# Patient Record
Sex: Male | Born: 1995 | Race: White | Hispanic: No | Marital: Single | Smoking: Never smoker
Health system: Southern US, Community
[De-identification: ages and names within clinical notes are randomized; demographics above are authoritative.]

## PROBLEM LIST (undated history)

## (undated) DIAGNOSIS — I10 Essential (primary) hypertension: Secondary | ICD-10-CM

## (undated) DIAGNOSIS — F329 Major depressive disorder, single episode, unspecified: Secondary | ICD-10-CM

## (undated) DIAGNOSIS — S82301A Unspecified fracture of lower end of right tibia, initial encounter for closed fracture: Secondary | ICD-10-CM

## (undated) DIAGNOSIS — K219 Gastro-esophageal reflux disease without esophagitis: Secondary | ICD-10-CM

## (undated) DIAGNOSIS — F909 Attention-deficit hyperactivity disorder, unspecified type: Secondary | ICD-10-CM

## (undated) DIAGNOSIS — A0472 Enterocolitis due to Clostridium difficile, not specified as recurrent: Secondary | ICD-10-CM

## (undated) DIAGNOSIS — F32A Depression, unspecified: Secondary | ICD-10-CM

## (undated) HISTORY — PX: WISDOM TOOTH EXTRACTION: SHX21

## (undated) HISTORY — DX: Major depressive disorder, single episode, unspecified: F32.9

## (undated) HISTORY — DX: Essential (primary) hypertension: I10

## (undated) HISTORY — DX: Depression, unspecified: F32.A

## (undated) HISTORY — DX: Gastro-esophageal reflux disease without esophagitis: K21.9

---
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
## 2014-05-26 ENCOUNTER — Encounter: Payer: Self-pay | Admitting: Internal Medicine

## 2014-05-26 ENCOUNTER — Ambulatory Visit (INDEPENDENT_AMBULATORY_CARE_PROVIDER_SITE_OTHER): Payer: 59 | Admitting: Internal Medicine

## 2014-05-26 VITALS — BP 114/64 | HR 110 | Temp 98.0°F | Ht 74.0 in | Wt 169.0 lb

## 2014-05-26 DIAGNOSIS — F909 Attention-deficit hyperactivity disorder, unspecified type: Secondary | ICD-10-CM | POA: Diagnosis not present

## 2014-05-26 DIAGNOSIS — H6691 Otitis media, unspecified, right ear: Secondary | ICD-10-CM

## 2014-05-26 DIAGNOSIS — H65112 Acute and subacute allergic otitis media (mucoid) (sanguinous) (serous), left ear: Secondary | ICD-10-CM

## 2014-05-26 DIAGNOSIS — H7291 Unspecified perforation of tympanic membrane, right ear: Principal | ICD-10-CM

## 2014-05-26 DIAGNOSIS — H66011 Acute suppurative otitis media with spontaneous rupture of ear drum, right ear: Secondary | ICD-10-CM

## 2014-05-26 DIAGNOSIS — F988 Other specified behavioral and emotional disorders with onset usually occurring in childhood and adolescence: Secondary | ICD-10-CM

## 2014-05-26 DIAGNOSIS — R040 Epistaxis: Secondary | ICD-10-CM

## 2014-05-26 MED ORDER — DOXYCYCLINE HYCLATE 100 MG PO TABS: 100.0000 mg | ORAL_TABLET | Freq: Two times a day (BID) | ORAL | Status: DC

## 2014-05-26 NOTE — Progress Notes (Signed)
   Subjective:    Patient ID: Zachary Gilbert, male    DOB: 01/16/1996, 18 y.o.   MRN: [id number]  HPI 19 year old male not seen here since 2014 with history of anxiety and attention deficit disorder. Currently on Adderall. Says his grades improved quite a bit after starting Adderall. Mother has stated in the past he suffers from lack of motivation. He had bilateral grommet tubes placed when he was about [age]-year-old. History of recurrent bouts of otitis media. He's intolerant of erythromycin and Biaxin-these cause nausea.  He's been on Paxil in the past and Concerta.  He went to the beach on February 25. Says his friend had a respiratory infection and he subsequently came down with it. He developed ear problems and apparently [facility] [place]. We have requested records but have not received them. Apparently had a perforated right ear drum and was placed on Ciprodex eardrops. He was not placed on oral antibiotics. Says both ears were hurting. He also has developed a left nosebleed which apparently was fairly severe couple of nights ago. Denies blowing his nose or nasal trauma.  He has some anxiety about losing his hearing. Says both ears are stopped up and he cannot hear.    Review of Systems     Objective:   Physical Exam  Dried blood in left nostril. Left TM is chronically scarred and retracted but not red. Pharynx is red. Tonsils are present without exudate. Right TM shows perforation right TM with no active drainage      Assessment & Plan:  Perforated right TM  Nosebleed  Left otitis media  Plan: Doxycycline 100 mg twice daily for 10 days. Continue using Ciprodex eardrops and return in 2 weeks. Note to be excused from class today. Patient asking if he can go to gym. They can be best to be staph the gym for the next week.  25 minutes spent with patient and also on the phone trying to obtain records and getting clear  history.

## 2014-05-26 NOTE — Patient Instructions (Addendum)
Doxycycline 100 mg twice a day  For 10 days. Use ear drops in right ear. Return in 2 weeks.

## 2014-06-10 ENCOUNTER — Ambulatory Visit (INDEPENDENT_AMBULATORY_CARE_PROVIDER_SITE_OTHER): Payer: 59 | Admitting: Internal Medicine

## 2014-06-10 ENCOUNTER — Encounter: Payer: Self-pay | Admitting: Internal Medicine

## 2014-06-10 VITALS — BP 102/78 | HR 82 | Temp 97.8°F | Ht 74.0 in | Wt 171.0 lb

## 2014-06-10 DIAGNOSIS — H7291 Unspecified perforation of tympanic membrane, right ear: Secondary | ICD-10-CM

## 2014-06-10 NOTE — Progress Notes (Signed)
   Subjective:    Patient ID: Zachary Gilbert, male    DOB: 02/28/1996, 19 y.o.   MRN: [id number]  HPI For follow-up on perforated right TM. Says he still feels air going through the tympanic membrane when he runs. Says it is not hurting. No drainage.    Review of Systems     Objective:   Physical Exam  Clean perforation right TM without drainage. Audiometry is normal at 20 and 40 dB respectively.      Assessment & Plan:  Perforation right TM  Normal hearing  Plan: Appointment with ENT physician for further advisement regarding healing etc

## 2014-06-10 NOTE — Patient Instructions (Signed)
You have normal hearing in the right ear. Appointment with ENT physician for further evaluation of perforation

## 2014-11-27 ENCOUNTER — Encounter (HOSPITAL_BASED_OUTPATIENT_CLINIC_OR_DEPARTMENT_OTHER): Payer: Self-pay | Admitting: Emergency Medicine

## 2014-11-27 ENCOUNTER — Emergency Department (HOSPITAL_BASED_OUTPATIENT_CLINIC_OR_DEPARTMENT_OTHER)
Admission: EM | Admit: 2014-11-27 | Discharge: 2014-11-27 | Disposition: A | Discharge status: Home / Self Care | Payer: 59 | Attending: Emergency Medicine | Admitting: Emergency Medicine

## 2014-11-27 ENCOUNTER — Emergency Department (HOSPITAL_BASED_OUTPATIENT_CLINIC_OR_DEPARTMENT_OTHER): Payer: 59

## 2014-11-27 DIAGNOSIS — R110 Nausea: Secondary | ICD-10-CM | POA: Insufficient documentation

## 2014-11-27 DIAGNOSIS — T402X5A Adverse effect of other opioids, initial encounter: Secondary | ICD-10-CM | POA: Insufficient documentation

## 2014-11-27 DIAGNOSIS — R109 Unspecified abdominal pain: Secondary | ICD-10-CM | POA: Insufficient documentation

## 2014-11-27 DIAGNOSIS — Z79899 Other long term (current) drug therapy: Secondary | ICD-10-CM | POA: Insufficient documentation

## 2014-11-27 DIAGNOSIS — K5903 Drug induced constipation: Secondary | ICD-10-CM

## 2014-11-27 DIAGNOSIS — K5900 Constipation, unspecified: Secondary | ICD-10-CM | POA: Insufficient documentation

## 2014-11-27 DIAGNOSIS — F909 Attention-deficit hyperactivity disorder, unspecified type: Secondary | ICD-10-CM | POA: Insufficient documentation

## 2014-11-27 HISTORY — DX: Attention-deficit hyperactivity disorder, unspecified type: F90.9

## 2014-11-27 MED ORDER — LACTULOSE 10 GM/15ML PO SOLN: 10.0000 g | Freq: Three times a day (TID) | ORAL | Status: DC

## 2014-11-27 MED ORDER — FLEET ENEMA 7-19 GM/118ML RE ENEM
1.0000 | ENEMA | Freq: Once | RECTAL | Status: AC
  Administered 2014-11-27: 1 via RECTAL
  Filled 2014-11-27: qty 1

## 2014-11-27 NOTE — ED Notes (Signed)
Patient c/o constipation, does not know for sure when his last BM was, but states it was at least 3 days ago. States he tried saline enema, suppository with no results.  Denies other symptoms other than bloating.

## 2014-11-27 NOTE — ED Notes (Signed)
MD at bedside. To inform of x-ray results and recommendation

## 2014-11-27 NOTE — Discharge Instructions (Signed)

## 2014-11-27 NOTE — ED Provider Notes (Signed)
CSN: [id number]     Arrival date & time 11/27/14  8295 History   None    Chief Complaint  Patient presents with  . Constipation    HPI  19 yo M h/o ADHD p/w constipation for 1 week after taking 3 Percocets recreationally. He tried magnesium and 2 enemas late last night and had some watery bowel movements but no solid stool. He says he felt a little nauseous this morning but denies vomiting, and he's passing gas. His abdomen doesn't hurt per say, but just feels bloated.  Past Medical History  Diagnosis Date  . ADHD (attention deficit hyperactivity disorder)    Past Surgical History  Procedure Laterality Date  . Wisdom tooth extraction     Family History  Problem Relation Age of Onset  . Anxiety disorder Mother    Social History  Substance Use Topics  . Smoking status: Never Smoker   . Smokeless tobacco: None  . Alcohol Use: Yes    Review of Systems  Constitutional: Positive for appetite change. Negative for fever.  Gastrointestinal: Positive for nausea, abdominal pain, constipation and abdominal distention. Negative for vomiting, diarrhea and blood in stool.  Genitourinary: Negative for dysuria.  Musculoskeletal: Negative for arthralgias.  Neurological: Negative for headaches.    Allergies  Biaxin; Erythorbic acid; and Travoprost (bak free)  Home Medications   Prior to Admission medications   Medication Sig Start Date End Date Taking? Authorizing Provider  lisdexamfetamine (VYVANSE) 20 MG capsule Take 5 mg by mouth daily.   Yes Historical Provider, MD  methylphenidate (RITALIN) 10 MG tablet Take 10 mg by mouth 2 (two) times daily.   Yes Historical Provider, MD  amphetamine-dextroamphetamine (ADDERALL) 10 MG tablet  05/01/14   Historical Provider, MD  CIPRODEX otic suspension  05/20/14   Historical Provider, MD  traMADol-acetaminophen (ULTRACET) 37.5-325 MG per tablet  05/20/14   Historical Provider, MD   BP 149/90 mmHg  Pulse 76  Temp(Src) 98.1 F (36.7 C) (Oral)  Resp  20  Ht  (1.905 m)  Wt 178 lb (80.74 kg)  BMI 22.25 kg/m2  SpO2 100% Physical Exam  Constitutional: He appears well-developed and well-nourished.  HENT:  Head: Normocephalic and atraumatic.  Eyes: Conjunctivae and EOM are normal.  Neck: Normal range of motion. Neck supple.  Cardiovascular: Normal rate and regular rhythm.   Pulmonary/Chest: Effort normal and breath sounds normal.  Abdominal: Soft. Bowel sounds are normal. He exhibits no distension. There is no tenderness. There is no rebound and no guarding.  Musculoskeletal: Normal range of motion.  Skin: Skin is warm and dry.  Psychiatric: He has a normal mood and affect. His behavior is normal.    ED Course  Procedures (including critical care time) Labs Review Labs Reviewed - No data to display  Imaging Review No results found. I have personally reviewed and evaluated these images and lab results as part of my medical decision-making.   EKG Interpretation None      MDM   Final diagnoses:  Constipation due to opioid therapy    19 yo M presents with constipation for 5 days after recreational percocet abuse. No nausea/vomiting, passing flatus, and abdominal exam was benign. KUB showed stool in small and large intestine. Fleets enema got out some stool and he was discharged home with lactulose and recommendation he steer clear from recreational opiate use in the future.  Selina Cooley, MD 11/27/14 1519  Rolan Bucco, MD 11/27/14 [phone]

## 2014-11-27 NOTE — ED Notes (Signed)
MD at bedside. 

## 2014-11-27 NOTE — ED Notes (Signed)
States has not had a solid formed stool "in awhile" states has tried enema and mag citrate with poor results

## 2014-11-27 NOTE — ED Notes (Signed)
States feels full and bloated at this time.

## 2017-03-30 ENCOUNTER — Emergency Department (HOSPITAL_COMMUNITY)
Admission: EM | Admit: 2017-03-30 | Discharge: 2017-03-31 | Disposition: A | Discharge status: Home / Self Care | Payer: 59 | Attending: Emergency Medicine | Admitting: Emergency Medicine

## 2017-03-30 ENCOUNTER — Emergency Department (HOSPITAL_COMMUNITY): Payer: 59

## 2017-03-30 DIAGNOSIS — Y939 Activity, unspecified: Secondary | ICD-10-CM | POA: Insufficient documentation

## 2017-03-30 DIAGNOSIS — S6291XA Unspecified fracture of right wrist and hand, initial encounter for closed fracture: Secondary | ICD-10-CM | POA: Diagnosis not present

## 2017-03-30 DIAGNOSIS — Y92410 Unspecified street and highway as the place of occurrence of the external cause: Secondary | ICD-10-CM | POA: Insufficient documentation

## 2017-03-30 DIAGNOSIS — Z79899 Other long term (current) drug therapy: Secondary | ICD-10-CM | POA: Diagnosis not present

## 2017-03-30 DIAGNOSIS — Z23 Encounter for immunization: Secondary | ICD-10-CM | POA: Diagnosis not present

## 2017-03-30 DIAGNOSIS — S99911A Unspecified injury of right ankle, initial encounter: Secondary | ICD-10-CM | POA: Diagnosis present

## 2017-03-30 DIAGNOSIS — F909 Attention-deficit hyperactivity disorder, unspecified type: Secondary | ICD-10-CM | POA: Diagnosis not present

## 2017-03-30 DIAGNOSIS — R079 Chest pain, unspecified: Secondary | ICD-10-CM | POA: Insufficient documentation

## 2017-03-30 DIAGNOSIS — R109 Unspecified abdominal pain: Secondary | ICD-10-CM | POA: Diagnosis not present

## 2017-03-30 DIAGNOSIS — R55 Syncope and collapse: Secondary | ICD-10-CM | POA: Diagnosis not present

## 2017-03-30 DIAGNOSIS — Y999 Unspecified external cause status: Secondary | ICD-10-CM | POA: Insufficient documentation

## 2017-03-30 DIAGNOSIS — E7119 Other disorders of branched-chain amino-acid metabolism: Secondary | ICD-10-CM

## 2017-03-30 DIAGNOSIS — S82891A Other fracture of right lower leg, initial encounter for closed fracture: Secondary | ICD-10-CM | POA: Diagnosis not present

## 2017-03-30 LAB — I-STAT CHEM 8, ED
BUN: 22 mg/dL — ABNORMAL HIGH (ref 6–20)
CALCIUM ION: 1.07 mmol/L — AB (ref 1.15–1.40)
CREATININE: 1.1 mg/dL (ref 0.61–1.24)
Chloride: 104 mmol/L (ref 101–111)
Glucose, Bld: 136 mg/dL — ABNORMAL HIGH (ref 65–99)
HEMATOCRIT: 43 % (ref 39.0–52.0)
HEMOGLOBIN: 14.6 g/dL (ref 13.0–17.0)
Potassium: 3.7 mmol/L (ref 3.5–5.1)
SODIUM: 140 mmol/L (ref 135–145)
TCO2: 24 mmol/L (ref 22–32)

## 2017-03-30 LAB — I-STAT CG4 LACTIC ACID, ED: Lactic Acid, Venous: 1.5 mmol/L (ref 0.5–1.9)

## 2017-03-30 MED ORDER — SODIUM CHLORIDE 0.9 % IV BOLUS (SEPSIS)
1000.0000 mL | Freq: Once | INTRAVENOUS | Status: AC
  Administered 2017-03-30: 1000 mL via INTRAVENOUS

## 2017-03-30 MED ORDER — TETANUS-DIPHTH-ACELL PERTUSSIS 5-2.5-18.5 LF-MCG/0.5 IM SUSP
0.5000 mL | Freq: Once | INTRAMUSCULAR | Status: AC
  Administered 2017-03-30: 0.5 mL via INTRAMUSCULAR
  Filled 2017-03-30: qty 0.5

## 2017-03-30 MED ORDER — SODIUM CHLORIDE 0.9 % IV BOLUS (SEPSIS)
1000.0000 mL | Freq: Once | INTRAVENOUS | Status: AC
  Administered 2017-03-31: 1000 mL via INTRAVENOUS

## 2017-03-30 MED ORDER — SODIUM CHLORIDE 0.9 % IV SOLN: INTRAVENOUS | Status: DC

## 2017-03-30 MED ORDER — FENTANYL CITRATE (PF) 100 MCG/2ML IJ SOLN
50.0000 ug | Freq: Once | INTRAMUSCULAR | Status: DC
Start: 2017-03-30 — End: 2017-03-31
  Filled 2017-03-30: qty 2

## 2017-03-30 MED ORDER — CLINDAMYCIN PHOSPHATE 600 MG/50ML IV SOLN
600.0000 mg | Freq: Once | INTRAVENOUS | Status: AC
  Administered 2017-03-30: 600 mg via INTRAVENOUS
  Filled 2017-03-30: qty 50
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
## 2017-03-31 ENCOUNTER — Emergency Department (HOSPITAL_COMMUNITY): Payer: 59

## 2017-03-31 LAB — CBC
HEMATOCRIT: 43 % (ref 39.0–52.0)
HEMOGLOBIN: 14.4 g/dL (ref 13.0–17.0)
MCH: 27.1 pg (ref 26.0–34.0)
MCHC: 33.5 g/dL (ref 30.0–36.0)
MCV: 81 fL (ref 78.0–100.0)
Platelets: 204 10*3/uL (ref 150–400)
RBC: 5.31 MIL/uL (ref 4.22–5.81)
RDW: 12.5 % (ref 11.5–15.5)
WBC: 12.9 10*3/uL — AB (ref 4.0–10.5)

## 2017-03-31 LAB — COMPREHENSIVE METABOLIC PANEL
ALBUMIN: 4 g/dL (ref 3.5–5.0)
ALT: 22 U/L (ref 17–63)
ANION GAP: 8 (ref 5–15)
AST: 25 U/L (ref 15–41)
Alkaline Phosphatase: 47 U/L (ref 38–126)
BILIRUBIN TOTAL: 1.5 mg/dL — AB (ref 0.3–1.2)
BUN: 21 mg/dL — AB (ref 6–20)
CHLORIDE: 105 mmol/L (ref 101–111)
CO2: 25 mmol/L (ref 22–32)
Calcium: 8.7 mg/dL — ABNORMAL LOW (ref 8.9–10.3)
Creatinine, Ser: 1.08 mg/dL (ref 0.61–1.24)
GFR calc Af Amer: >60 mL/min (ref >60)
Glucose, Bld: 136 mg/dL — ABNORMAL HIGH (ref 65–99)
Potassium: 3.7 mmol/L (ref 3.5–5.1)
Sodium: 138 mmol/L (ref 135–145)
TOTAL PROTEIN: 6.2 g/dL — AB (ref 6.5–8.1)

## 2017-03-31 LAB — URINALYSIS, ROUTINE W REFLEX MICROSCOPIC
BACTERIA UA: NONE SEEN
BILIRUBIN URINE: NEGATIVE
GLUCOSE, UA: NEGATIVE mg/dL
KETONES UR: NEGATIVE mg/dL
LEUKOCYTES UA: NEGATIVE
NITRITE: NEGATIVE
PH: 6 (ref 5.0–8.0)
PROTEIN: NEGATIVE mg/dL
Specific Gravity, Urine: 1.015 (ref 1.005–1.030)

## 2017-03-31 LAB — ETHANOL: Alcohol, Ethyl (B): <10 mg/dL (ref <10)

## 2017-03-31 LAB — PROTIME-INR
INR: 1.16
Prothrombin Time: 14.7 seconds (ref 11.4–15.2)

## 2017-03-31 LAB — SAMPLE TO BLOOD BANK

## 2017-03-31 MED ORDER — OXYCODONE-ACETAMINOPHEN 5-325 MG PO TABS: 1.0000 | ORAL_TABLET | Freq: Four times a day (QID) | ORAL | 0 refills | Status: DC | PRN

## 2017-03-31 MED ORDER — KETOROLAC TROMETHAMINE 30 MG/ML IJ SOLN
30.0000 mg | Freq: Once | INTRAMUSCULAR | Status: AC
  Administered 2017-03-31: 30 mg via INTRAVENOUS
  Filled 2017-03-31: qty 1

## 2017-03-31 MED ORDER — IOPAMIDOL (ISOVUE-300) INJECTION 61%
INTRAVENOUS | Status: AC
  Administered 2017-03-31: 100 mL
  Filled 2017-03-31: qty 100
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
## 2017-03-31 NOTE — ED Notes (Signed)
Ortho Tech at bedside to apply splints

## 2017-03-31 NOTE — ED Notes (Signed)
Pt and family understood dc material. NAD noted. Script given at dc 

## 2017-03-31 NOTE — ED Notes (Signed)
C-collar removed after speaking with Primary MD.

## 2017-04-16 ENCOUNTER — Other Ambulatory Visit: Payer: Self-pay | Admitting: Orthopedic Surgery

## 2017-04-17 ENCOUNTER — Encounter (HOSPITAL_COMMUNITY): Payer: Self-pay

## 2017-04-17 ENCOUNTER — Encounter (HOSPITAL_COMMUNITY)
Admission: RE | Admit: 2017-04-17 | Discharge: 2017-04-17 | Disposition: A | Discharge status: Home / Self Care | Payer: 59 | Source: Ambulatory Visit | Attending: Orthopedic Surgery | Admitting: Orthopedic Surgery

## 2017-04-17 ENCOUNTER — Other Ambulatory Visit: Payer: Self-pay

## 2017-04-17 DIAGNOSIS — S8251XA Displaced fracture of medial malleolus of right tibia, initial encounter for closed fracture: Secondary | ICD-10-CM | POA: Diagnosis not present

## 2017-04-17 DIAGNOSIS — X58XXXA Exposure to other specified factors, initial encounter: Secondary | ICD-10-CM | POA: Diagnosis not present

## 2017-04-17 DIAGNOSIS — F909 Attention-deficit hyperactivity disorder, unspecified type: Secondary | ICD-10-CM | POA: Diagnosis not present

## 2017-04-17 LAB — CBC
HCT: 44 % (ref 39.0–52.0)
Hemoglobin: 14.6 g/dL (ref 13.0–17.0)
MCH: 27.8 pg (ref 26.0–34.0)
MCHC: 33.2 g/dL (ref 30.0–36.0)
MCV: 83.7 fL (ref 78.0–100.0)
PLATELETS: 313 10*3/uL (ref 150–400)
RBC: 5.26 MIL/uL (ref 4.22–5.81)
RDW: 13.6 % (ref 11.5–15.5)
WBC: 5.2 10*3/uL (ref 4.0–10.5)

## 2017-04-17 LAB — SURGICAL PCR SCREEN
MRSA, PCR: NEGATIVE
Staphylococcus aureus: NEGATIVE

## 2017-04-17 NOTE — Pre-Procedure Instructions (Signed)
Therisa Doyneaylor A Mcnamee  04/17/2017      [id number] [place] [address] [place] [id number] Phone: [phone] Fax: [phone]  [facility] [place], [place] [facility]- [address] [place] [id number] Phone: [phone] Fax: [phone]    Your procedure is scheduled on April 18, 2017.  [facility] at 1030 AM.  Call this number if you have problems the morning of surgery:  [phone]   Remember:  Do not eat food or drink liquids after midnight.  Take these medicines the morning of surgery with A SIP OF WATER acetaminophen (tylenol)-if needed, vancomycin (vancocin).  Beginning now, STOP taking any Aspirin (unless otherwise instructed by your surgeon), Aleve, Naproxen, Ibuprofen, Motrin, Advil, Goody's, BC's, all herbal medications, fish oil, and all vitamins  Continue all other medications as instructed by your physician except follow the above medication instructions before surgery   Do not wear jewelry.  Do not wear lotions, powders, or colognes, or deodorant.  Men may shave face and neck.  Do not bring valuables to the hospital.  [facility] is not responsible for any belongings or valuables.  Contacts, dentures or bridgework may not be worn into surgery.  Leave your suitcase in the car.  After surgery it may be brought to your room.  For patients admitted to the hospital, discharge time will be determined by your treatment team.  Patients discharged the day of surgery will not be allowed to drive home.   Special instructions:   [facility]- Preparing For Surgery  Before surgery, you can play an important role. Because skin is not sterile, your skin needs to be as free of germs as possible. You can reduce the number of germs on your skin by washing with CHG (chlorahexidine gluconate) Soap before surgery.  CHG is an antiseptic cleaner which kills germs and bonds  with the skin to continue killing germs even after washing.  Please do not use if you have an allergy to CHG or antibacterial soaps. If your skin becomes reddened/irritated stop using the CHG.  Do not shave (including legs and underarms) for at least 48 hours prior to first CHG shower. It is OK to shave your face.  Please follow these instructions carefully.   1. Shower the NIGHT BEFORE SURGERY and the MORNING OF SURGERY with CHG.   2. If you chose to wash your hair, wash your hair first as usual with your normal shampoo.  3. After you shampoo, rinse your hair and body thoroughly to remove the shampoo.  4. Use CHG as you would any other liquid soap. You can apply CHG directly to the skin and wash gently with a scrungie or a clean washcloth.   5. Apply the CHG Soap to your body ONLY FROM THE NECK DOWN.  Do not use on open wounds or open sores. Avoid contact with your eyes, ears, mouth and genitals (private parts). Wash Face and genitals (private parts)  with your normal soap.  6. Wash thoroughly, paying special attention to the area where your surgery will be performed.  7. Thoroughly rinse your body with warm water from the neck down.  8. DO NOT shower/wash with your normal soap after using and rinsing off the CHG Soap.  9. Pat yourself dry with a CLEAN TOWEL.  10. Wear CLEAN PAJAMAS to bed the night before surgery, wear comfortable clothes the morning of surgery  11. Place  CLEAN SHEETS on your bed the night of your first shower and DO NOT SLEEP WITH PETS.  Day of Surgery: Do not apply any deodorants/lotions. Please wear clean clothes to the hospital/surgery center.    Please read over the following fact sheets that you were given. Pain Booklet, Coughing and Deep Breathing, MRSA Information and Surgical Site Infection Prevention

## 2017-04-17 NOTE — Progress Notes (Addendum)
PCP: Marlan PalauMary Baxley, MD  Cardiologist: pt denies  EKG: pt denies past year, no cardiac history  Stress test: pt denies ever  ECHO: pt denies ever  Cardiac Cath: pt denies ever  Chest x-ray: 1 view-03/31/17 in Epic-no recent respiratory infections/complications

## 2017-04-18 ENCOUNTER — Ambulatory Visit (HOSPITAL_COMMUNITY): Payer: 59 | Admitting: Certified Registered Nurse Anesthetist

## 2017-04-18 ENCOUNTER — Encounter (HOSPITAL_COMMUNITY)
Admission: RE | Disposition: A | Discharge status: Home / Self Care | Payer: Self-pay | Source: Ambulatory Visit | Attending: Orthopedic Surgery

## 2017-04-18 ENCOUNTER — Encounter (HOSPITAL_COMMUNITY): Payer: Self-pay | Admitting: *Deleted

## 2017-04-18 ENCOUNTER — Ambulatory Visit (HOSPITAL_COMMUNITY)
Admission: RE | Admit: 2017-04-18 | Discharge: 2017-04-18 | Disposition: A | Discharge status: Home / Self Care | Payer: 59 | Source: Ambulatory Visit | Attending: Orthopedic Surgery | Admitting: Orthopedic Surgery

## 2017-04-18 DIAGNOSIS — S8251XA Displaced fracture of medial malleolus of right tibia, initial encounter for closed fracture: Secondary | ICD-10-CM | POA: Diagnosis not present

## 2017-04-18 DIAGNOSIS — F909 Attention-deficit hyperactivity disorder, unspecified type: Secondary | ICD-10-CM | POA: Insufficient documentation

## 2017-04-18 DIAGNOSIS — S82301A Unspecified fracture of lower end of right tibia, initial encounter for closed fracture: Secondary | ICD-10-CM

## 2017-04-18 DIAGNOSIS — X58XXXA Exposure to other specified factors, initial encounter: Secondary | ICD-10-CM | POA: Insufficient documentation

## 2017-04-18 HISTORY — PX: ORIF ANKLE FRACTURE: SHX5408

## 2017-04-18 HISTORY — DX: Unspecified fracture of lower end of right tibia, initial encounter for closed fracture: S82.301A

## 2017-04-18 HISTORY — DX: Enterocolitis due to Clostridium difficile, not specified as recurrent: A04.72

## 2017-04-18 SURGERY — OPEN REDUCTION INTERNAL FIXATION (ORIF) ANKLE FRACTURE
Anesthesia: Regional | Site: Leg Lower | Laterality: Right

## 2017-04-18 MED ORDER — FENTANYL CITRATE (PF) 100 MCG/2ML IJ SOLN
100.0000 ug | Freq: Once | INTRAMUSCULAR | Status: AC
  Administered 2017-04-18: 100 ug via INTRAVENOUS

## 2017-04-18 MED ORDER — MIDAZOLAM HCL 2 MG/2ML IJ SOLN
2.0000 mg | Freq: Once | INTRAMUSCULAR | Status: AC
  Administered 2017-04-18: 2 mg via INTRAVENOUS

## 2017-04-18 MED ORDER — OXYCODONE HCL 5 MG PO TABS
ORAL_TABLET | ORAL | Status: AC
  Filled 2017-04-18: qty 1

## 2017-04-18 MED ORDER — FENTANYL CITRATE (PF) 100 MCG/2ML IJ SOLN
INTRAMUSCULAR | Status: AC
  Administered 2017-04-18: 100 ug via INTRAVENOUS
  Filled 2017-04-18: qty 2

## 2017-04-18 MED ORDER — FENTANYL CITRATE (PF) 100 MCG/2ML IJ SOLN
25.0000 ug | INTRAMUSCULAR | Status: DC | PRN
  Administered 2017-04-18 (×2): 25 ug via INTRAVENOUS

## 2017-04-18 MED ORDER — MIDAZOLAM HCL 2 MG/2ML IJ SOLN
INTRAMUSCULAR | Status: AC
  Administered 2017-04-18: 2 mg via INTRAVENOUS
  Filled 2017-04-18: qty 2

## 2017-04-18 MED ORDER — OXYCODONE HCL 5 MG/5ML PO SOLN: 5.0000 mg | Freq: Once | ORAL | Status: DC | PRN

## 2017-04-18 MED ORDER — DEXAMETHASONE SODIUM PHOSPHATE 10 MG/ML IJ SOLN
INTRAMUSCULAR | Status: AC
  Filled 2017-04-18: qty 1

## 2017-04-18 MED ORDER — LACTATED RINGERS IV SOLN
INTRAVENOUS | Status: DC
  Administered 2017-04-18 (×2): via INTRAVENOUS

## 2017-04-18 MED ORDER — PROPOFOL 10 MG/ML IV BOLUS
INTRAVENOUS | Status: DC | PRN
  Administered 2017-04-18: 200 mg via INTRAVENOUS

## 2017-04-18 MED ORDER — HYDROMORPHONE HCL 1 MG/ML IJ SOLN: 0.2500 mg | INTRAMUSCULAR | Status: DC | PRN

## 2017-04-18 MED ORDER — HYDROMORPHONE HCL 1 MG/ML IJ SOLN
0.2500 mg | INTRAMUSCULAR | Status: DC | PRN
Start: 2017-04-18 — End: 2017-04-18

## 2017-04-18 MED ORDER — ONDANSETRON HCL 4 MG/2ML IJ SOLN: 4.0000 mg | Freq: Once | INTRAMUSCULAR | Status: DC | PRN

## 2017-04-18 MED ORDER — ROCURONIUM BROMIDE 100 MG/10ML IV SOLN
INTRAVENOUS | Status: DC | PRN
  Administered 2017-04-18: 20 mg via INTRAVENOUS
  Administered 2017-04-18: 50 mg via INTRAVENOUS

## 2017-04-18 MED ORDER — DEXAMETHASONE SODIUM PHOSPHATE 10 MG/ML IJ SOLN
INTRAMUSCULAR | Status: DC | PRN
  Administered 2017-04-18: 10 mg via INTRAVENOUS

## 2017-04-18 MED ORDER — SUGAMMADEX SODIUM 200 MG/2ML IV SOLN
INTRAVENOUS | Status: AC
  Filled 2017-04-18: qty 2

## 2017-04-18 MED ORDER — ONDANSETRON HCL 4 MG/2ML IJ SOLN
INTRAMUSCULAR | Status: DC | PRN
  Administered 2017-04-18: 4 mg via INTRAVENOUS

## 2017-04-18 MED ORDER — OXYCODONE HCL 5 MG PO TABS: 5.0000 mg | ORAL_TABLET | Freq: Once | ORAL | Status: DC | PRN

## 2017-04-18 MED ORDER — ROCURONIUM BROMIDE 10 MG/ML (PF) SYRINGE
PREFILLED_SYRINGE | INTRAVENOUS | Status: AC
  Filled 2017-04-18: qty 5

## 2017-04-18 MED ORDER — 0.9 % SODIUM CHLORIDE (POUR BTL) OPTIME
TOPICAL | Status: DC | PRN
  Administered 2017-04-18: 1000 mL

## 2017-04-18 MED ORDER — CEFAZOLIN SODIUM-DEXTROSE 2-4 GM/100ML-% IV SOLN
INTRAVENOUS | Status: AC
  Filled 2017-04-18: qty 100

## 2017-04-18 MED ORDER — LIDOCAINE 2% (20 MG/ML) 5 ML SYRINGE
INTRAMUSCULAR | Status: DC | PRN
  Administered 2017-04-18: 100 mg via INTRAVENOUS

## 2017-04-18 MED ORDER — ONDANSETRON HCL 4 MG/2ML IJ SOLN
INTRAMUSCULAR | Status: AC
  Filled 2017-04-18: qty 2

## 2017-04-18 MED ORDER — OXYCODONE HCL 5 MG PO TABS
5.0000 mg | ORAL_TABLET | Freq: Once | ORAL | Status: AC | PRN
  Administered 2017-04-18: 5 mg via ORAL

## 2017-04-18 MED ORDER — VANCOMYCIN HCL IN DEXTROSE 1-5 GM/200ML-% IV SOLN
1000.0000 mg | Freq: Once | INTRAVENOUS | Status: AC
Start: 2017-04-18 — End: 2017-04-18
  Administered 2017-04-18: 1000 mg via INTRAVENOUS
  Filled 2017-04-18: qty 200

## 2017-04-18 MED ORDER — FENTANYL CITRATE (PF) 100 MCG/2ML IJ SOLN
INTRAMUSCULAR | Status: AC
  Filled 2017-04-18: qty 2

## 2017-04-18 MED ORDER — FENTANYL CITRATE (PF) 100 MCG/2ML IJ SOLN
INTRAMUSCULAR | Status: DC | PRN
  Administered 2017-04-18: 100 ug via INTRAVENOUS

## 2017-04-18 MED ORDER — CEFAZOLIN SODIUM-DEXTROSE 2-4 GM/100ML-% IV SOLN: 2.0000 g | INTRAVENOUS | Status: DC

## 2017-04-18 MED ORDER — MIDAZOLAM HCL 2 MG/2ML IJ SOLN
INTRAMUSCULAR | Status: DC | PRN
  Administered 2017-04-18: 2 mg via INTRAVENOUS

## 2017-04-18 MED ORDER — FENTANYL CITRATE (PF) 250 MCG/5ML IJ SOLN
INTRAMUSCULAR | Status: AC
  Filled 2017-04-18: qty 5

## 2017-04-18 MED ORDER — MIDAZOLAM HCL 2 MG/2ML IJ SOLN
INTRAMUSCULAR | Status: AC
  Filled 2017-04-18: qty 2

## 2017-04-18 MED ORDER — CHLORHEXIDINE GLUCONATE 4 % EX LIQD: 60.0000 mL | Freq: Once | CUTANEOUS | Status: DC

## 2017-04-18 MED ORDER — OXYCODONE HCL 5 MG PO TABS: 5.0000 mg | ORAL_TABLET | ORAL | 0 refills | Status: DC | PRN

## 2017-04-18 MED ORDER — OXYCODONE HCL 5 MG PO TABS
5.0000 mg | ORAL_TABLET | Freq: Once | ORAL | Status: DC | PRN
Start: 2017-04-18 — End: 2017-04-18

## 2017-04-18 MED ORDER — SUGAMMADEX SODIUM 200 MG/2ML IV SOLN
INTRAVENOUS | Status: DC | PRN
  Administered 2017-04-18: 160 mg via INTRAVENOUS

## 2017-04-18 MED ORDER — LIDOCAINE 2% (20 MG/ML) 5 ML SYRINGE
INTRAMUSCULAR | Status: AC
  Filled 2017-04-18: qty 10

## 2017-04-18 MED ORDER — PROPOFOL 10 MG/ML IV BOLUS
INTRAVENOUS | Status: AC
  Filled 2017-04-18: qty 40

## 2017-04-18 MED ORDER — OXYCODONE HCL 5 MG/5ML PO SOLN: 5.0000 mg | Freq: Once | ORAL | Status: AC | PRN

## 2017-04-18 SURGICAL SUPPLY — 67 items
BANDAGE ACE 4X5 VEL STRL LF (GAUZE/BANDAGES/DRESSINGS) ×2 IMPLANT
BANDAGE ACE 6X5 VEL STRL LF (GAUZE/BANDAGES/DRESSINGS) IMPLANT
BANDAGE ESMARK 6X9 LF (GAUZE/BANDAGES/DRESSINGS) ×1 IMPLANT
BENZOIN TINCTURE PRP APPL 2/3 (GAUZE/BANDAGES/DRESSINGS) IMPLANT
BIT DRILL 2.9 CANN QC NONSTRL (BIT) ×2 IMPLANT
BNDG COHESIVE 4X5 TAN STRL (GAUZE/BANDAGES/DRESSINGS) ×2 IMPLANT
BNDG ESMARK 6X9 LF (GAUZE/BANDAGES/DRESSINGS) ×2
BOOTCOVER CLEANROOM LRG (PROTECTIVE WEAR) ×2 IMPLANT
CANISTER SUCT 3000ML PPV (MISCELLANEOUS) ×2 IMPLANT
CLSR STERI-STRIP ANTIMIC 1/2X4 (GAUZE/BANDAGES/DRESSINGS) ×2 IMPLANT
COVER SURGICAL LIGHT HANDLE (MISCELLANEOUS) ×2 IMPLANT
CUFF TOURNIQUET SINGLE 34IN LL (TOURNIQUET CUFF) ×2 IMPLANT
CUFF TOURNIQUET SINGLE 44IN (TOURNIQUET CUFF) IMPLANT
DECANTER SPIKE VIAL GLASS SM (MISCELLANEOUS) IMPLANT
DRAPE C-ARM 42X72 X-RAY (DRAPES) IMPLANT
DRAPE C-ARMOR (DRAPES) IMPLANT
DRAPE INCISE IOBAN 66X45 STRL (DRAPES) IMPLANT
DRAPE OEC MINIVIEW 54X84 (DRAPES) ×2 IMPLANT
DRAPE U-SHAPE 47X51 STRL (DRAPES) ×2 IMPLANT
DRSG PAD ABDOMINAL 8X10 ST (GAUZE/BANDAGES/DRESSINGS) IMPLANT
DURAPREP 26ML APPLICATOR (WOUND CARE) ×2 IMPLANT
ELECT REM PT RETURN 9FT ADLT (ELECTROSURGICAL) ×2
ELECTRODE REM PT RTRN 9FT ADLT (ELECTROSURGICAL) ×1 IMPLANT
GAUZE SPONGE 4X4 12PLY STRL (GAUZE/BANDAGES/DRESSINGS) IMPLANT
GAUZE SPONGE 4X4 12PLY STRL LF (GAUZE/BANDAGES/DRESSINGS) ×2 IMPLANT
GAUZE XEROFORM 1X8 LF (GAUZE/BANDAGES/DRESSINGS) ×4 IMPLANT
GLOVE BIO SURGEON STRL SZ 6 (GLOVE) ×4 IMPLANT
GLOVE BIO SURGEON STRL SZ 6.5 (GLOVE) ×6 IMPLANT
GLOVE BIOGEL PI IND STRL 8.5 (GLOVE) ×1 IMPLANT
GLOVE BIOGEL PI INDICATOR 8.5 (GLOVE) ×1
GLOVE BIOGEL PI ORTHO PRO SZ8 (GLOVE) ×2
GLOVE ORTHO TXT STRL SZ7.5 (GLOVE) ×2 IMPLANT
GLOVE PI ORTHO PRO STRL SZ8 (GLOVE) ×2 IMPLANT
GLOVE SURG ORTHO 8.0 STRL STRW (GLOVE) ×2 IMPLANT
GLOVE SURG SS PI 6.0 STRL IVOR (GLOVE) ×4 IMPLANT
GOWN STRL REUS W/ TWL LRG LVL3 (GOWN DISPOSABLE) ×4 IMPLANT
GOWN STRL REUS W/ TWL XL LVL3 (GOWN DISPOSABLE) ×2 IMPLANT
GOWN STRL REUS W/TWL LRG LVL3 (GOWN DISPOSABLE) ×4
GOWN STRL REUS W/TWL XL LVL3 (GOWN DISPOSABLE) ×2
K-WIRE ACE 1.6X6 (WIRE) ×4
KIT BASIN OR (CUSTOM PROCEDURE TRAY) ×2 IMPLANT
KIT ROOM TURNOVER OR (KITS) ×2 IMPLANT
KWIRE ACE 1.6X6 (WIRE) ×2 IMPLANT
MANIFOLD NEPTUNE II (INSTRUMENTS) IMPLANT
MARKER SKIN DUAL TIP RULER LAB (MISCELLANEOUS) ×2 IMPLANT
NS IRRIG 1000ML POUR BTL (IV SOLUTION) ×2 IMPLANT
PACK ORTHO EXTREMITY (CUSTOM PROCEDURE TRAY) ×2 IMPLANT
PAD ABD 8X10 STRL (GAUZE/BANDAGES/DRESSINGS) ×2 IMPLANT
PAD ARMBOARD 7.5X6 YLW CONV (MISCELLANEOUS) ×2 IMPLANT
PAD CAST 4YDX4 CTTN HI CHSV (CAST SUPPLIES) ×1 IMPLANT
PADDING CAST COTTON 4X4 STRL (CAST SUPPLIES) ×1
PLATE SPIDER 16 (Washer) ×2 IMPLANT
SCREW ACE CAN 4.0 40M (Screw) ×4 IMPLANT
SPONGE LAP 4X18 X RAY DECT (DISPOSABLE) ×2 IMPLANT
SUCTION FRAZIER HANDLE 10FR (MISCELLANEOUS) ×1
SUCTION TUBE FRAZIER 10FR DISP (MISCELLANEOUS) ×1 IMPLANT
SUT MNCRL AB 3-0 PS2 18 (SUTURE) ×2 IMPLANT
SUT VIC AB 0 CT1 27 (SUTURE) ×1
SUT VIC AB 0 CT1 27XBRD ANBCTR (SUTURE) ×1 IMPLANT
SUT VIC AB 3-0 SH 8-18 (SUTURE) ×2 IMPLANT
SYR CONTROL 10ML LL (SYRINGE) ×2 IMPLANT
TOWEL OR 17X24 6PK STRL BLUE (TOWEL DISPOSABLE) IMPLANT
TOWEL OR 17X26 10 PK STRL BLUE (TOWEL DISPOSABLE) ×2 IMPLANT
TUBE CONNECTING 12X1/4 (SUCTIONS) ×2 IMPLANT
UNDERPAD 30X30 (UNDERPADS AND DIAPERS) ×2 IMPLANT
WATER STERILE IRR 1000ML POUR (IV SOLUTION) IMPLANT
YANKAUER SUCT BULB TIP NO VENT (SUCTIONS) IMPLANT

## 2017-04-18 NOTE — Progress Notes (Addendum)
Patient notified staff that he tested positive for Cdiff at Dr. Malena Edmanutlaws office.  Patient placed on enteric precautions  11:28 AM   Spoke with Dr. Dion SaucierLandau - he is aware that patient was positive for C.Diff   Patient was started on Vancomycin orally 1/10 by Dr. Dulce Sellarutlaw office note to be faxed to [facility].    IP consulted stated should continue enteric Precautions

## 2017-04-18 NOTE — Anesthesia Procedure Notes (Addendum)
Anesthesia Regional Block: Popliteal block   Pre-Anesthetic Checklist: ,, timeout performed, Correct Patient, Correct Site, Correct Laterality, Correct Procedure, Correct Position, site marked, Risks and benefits discussed,  Surgical consent,  Pre-op evaluation,  At surgeon's request and post-op pain management  Laterality: Right  Prep: chloraprep       Needles:  Injection technique: Single-shot  Needle Type: Stimulator Needle - 40      Needle Gauge: 22     Additional Needles:   Procedures:, nerve stimulator,,, ultrasound used (permanent image in chart),,,,  Narrative:  Start time: 04/18/2017 12:00 PM End time: 04/18/2017 12:10 PM Injection made incrementally with aspirations every 5 mL.  Performed by: Personally   Additional Notes: 25 cc 0.5% bupivacaine with 1:200 epi injected easily

## 2017-04-18 NOTE — Discharge Instructions (Signed)

## 2017-04-18 NOTE — Anesthesia Procedure Notes (Addendum)
Anesthesia Regional Block: Adductor canal block   Pre-Anesthetic Checklist: ,, timeout performed, Correct Patient, Correct Site, Correct Laterality, Correct Procedure, Correct Position, site marked, Risks and benefits discussed,  Surgical consent,  Pre-op evaluation,  At surgeon's request and post-op pain management  Laterality: Right  Prep: chloraprep       Needles:  Injection technique: Single-shot  Needle Type: Echogenic Stimulator Needle          Additional Needles:   Procedures:,,,, ultrasound used (permanent image in chart),,,,  Narrative:  Start time: 04/18/2017 12:00 PM End time: 04/18/2017 12:05 PM Injection made incrementally with aspirations every 5 mL.  Performed by: Personally  Anesthesiologist: Kipp BroodJoslin, Kristianna Saperstein, MD  Additional Notes: 15 cc 0.75% Ropivacaine injected easily

## 2017-04-18 NOTE — Anesthesia Procedure Notes (Signed)
Procedure Name: Intubation Date/Time: 04/18/2017 1:08 PM Performed by: Leonor Liv, CRNA Pre-anesthesia Checklist: Patient identified, Emergency Drugs available, Suction available and Patient being monitored Patient Re-evaluated:Patient Re-evaluated prior to induction Oxygen Delivery Method: Circle System Utilized Preoxygenation: Pre-oxygenation with 100% oxygen Induction Type: IV induction Ventilation: Mask ventilation without difficulty Laryngoscope Size: Mac and 4 Grade View: Grade I Tube type: Oral Tube size: 7.5 mm Number of attempts: 1 Airway Equipment and Method: Stylet and Oral airway Placement Confirmation: ETT inserted through vocal cords under direct vision,  positive ETCO2 and breath sounds checked- equal and bilateral Secured at: 22 cm Tube secured with: Tape Dental Injury: Teeth and Oropharynx as per pre-operative assessment

## 2017-04-18 NOTE — Transfer of Care (Signed)
Immediate Anesthesia Transfer of Care Note  Patient: Zachary Gilbert  Procedure(s) Performed: RIGHT OPEN REDUCTION INTERNAL FIXATION (ORIF) DISTAL TIBIA FRACTURE AND MEDIAL MALLEOLAR FRACTURE (Right Leg Lower)  Patient Location: PACU  Anesthesia Type:General  Level of Consciousness: awake, alert  and oriented  Airway & Oxygen Therapy: Patient Spontanous Breathing and Patient connected to nasal cannula oxygen  Post-op Assessment: Report given to RN, Post -op Vital signs reviewed and stable and Patient moving all extremities  Post vital signs: Reviewed and stable  Last Vitals:  Vitals:   04/18/17 1029 04/18/17 1513  BP: 129/74 125/89  Pulse: 94 (!) 115  Resp: 20 13  Temp: 36.7 C 37 C  SpO2: 99% 96%    Last Pain:  Vitals:   04/18/17 1029  TempSrc: Oral      Patients Stated Pain Goal: 4 (04/18/17 1031)  Complications: No apparent anesthesia complications
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
## 2017-04-18 NOTE — Anesthesia Postprocedure Evaluation (Signed)
Anesthesia Post Note  Patient: Zachary Gilbert  Procedure(s) Performed: RIGHT OPEN REDUCTION INTERNAL FIXATION (ORIF) DISTAL TIBIA FRACTURE AND MEDIAL MALLEOLAR FRACTURE (Right Leg Lower)     Patient location during evaluation: PACU Anesthesia Type: General and Regional Level of consciousness: awake and alert Pain management: pain level controlled Vital Signs Assessment: post-procedure vital signs reviewed and stable Respiratory status: spontaneous breathing, nonlabored ventilation, respiratory function stable and patient connected to nasal cannula oxygen Cardiovascular status: blood pressure returned to baseline and stable Postop Assessment: no apparent nausea or vomiting Anesthetic complications: no    Last Vitals:  Vitals:   04/18/17 1545 04/18/17 1555  BP:  124/81  Pulse: (!) 107 (!) 104  Resp: 17 18  Temp:    SpO2: 99% 100%    Last Pain:  Vitals:   04/18/17 1555  TempSrc:   PainSc: 4                  John Williamsen

## 2017-04-18 NOTE — Op Note (Addendum)
04/18/2017  2:37 PM  PATIENT:  Zachary Gilbert    PRE-OPERATIVE DIAGNOSIS:  RIGHT distal tibia and medial malleolus fracture, intra-articular  POST-OPERATIVE DIAGNOSIS:  Same  PROCEDURE:  RIGHT OPEN REDUCTION INTERNAL FIXATION (ORIF) DISTAL TIBIA FRACTURE ARTICULAR PLAFOND AND AND MEDIAL MALLEOLAR FRACTURE           SURGEON:  Eulas Post, MD  PHYSICIAN ASSISTANT: Janace Litten, OPA-C, present and scrubbed throughout the case, critical for completion in a timely fashion, and for retraction, instrumentation, and closure.  ANESTHESIA:   General  PREOPERATIVE INDICATIONS:  Zachary Gilbert is a  22 y.o. male who broke his right distal tibia and medial malleolus about 2 weeks ago, and also had an index metacarpal fracture, and elected for surgical management to restore the articular surface of the ankle.  The risks benefits and alternatives were discussed with the patient preoperatively including but not limited to the risks of infection, bleeding, nerve injury, cardiopulmonary complications, the need for revision surgery, among others, and the patient was willing to proceed.  We also discussed the risks for malunion, nonunion, posttraumatic arthritis, hardware prominence, hardware failure, need for hardware removal, neurologic entrapment and injury to the lower extremity.  ESTIMATED BLOOD LOSS: Minimal  OPERATIVE IMPLANTS: Biomet 4.0 mm cannulated screw x1 for the medial malleolus with a 4.0 mm cannulated screw with a spider washer taken directly down to bone under direct visualization for the anterior tibial plafond.  OPERATIVE FINDINGS: Displaced intra-articular fracture of the distal tibia anteriorly, the syndesmosis appeared stable, the medial malleolus had slight displacement that was more than it was on the CAT scan preoperatively, bone quality was reasonably good.  OPERATIVE PROCEDURE: The patient was brought to the operating room and placed in supine position.  General  anesthesia was administered.  He was already on IV vancomycin, and we continued his regimen, given that he had C. difficile colitis.  The right lower extremity was prepped and draped in usual sterile fashion.  Timeout performed.  The leg was elevated and exsanguinated and the tourniquet was inflated.  Medial malleolus was evaluated using the C-arm after a timeout performed, and I felt that the fracture was slightly displaced compared to where it was at the preoperative CAT scan, so I did make an incision over the medial malleolus in order to adjust the fracture position to get it as anatomic as possible.  I placed a guidepin through the central portion of the main fragment, and then placed a 40 mm screw that had excellent purchase, compression at the fracture site, and anatomic restoration of the articular cartilage at the articular plafond as confirmed by C-arm.  I then went to the anterior fracture, and localized it under C-arm guidance, made an incision along the anterior aspect of the ankle, dissected bluntly down through the extensor retinaculum and musculature protecting the neurovascular bundle, exposed the fracture site, manipulated it, it was not particularly mobile but I was ultimately able to satisfactorily reduce it distally in order to restore the congruency of the plafond and.  Once this was in the appropriate position and confirmed on C-arm I used a guidewire for the cannulated screw, placed this across the fracture site, measured the length directly to the posterior most aspect of the tibia, I did not feel I needed to be bicortical because I had good cancellus bone of the large segment of that posteriorly, I applied a washer, I took care to make sure that the washer did not ensnare any of the  soft tissue or musculature or neurovascular structures surrounding.  This was directly opposed to bone.  I used C-arm to confirm anatomic alignment of the ankle and was satisfactory with the reduction, and  then removed the instruments, irrigated the wound copiously and repaired the subtenons tissue with Vicryl followed by routine closure for the skin.  A posterior splint was applied.  He already had a saphenous nerve block as well as a popliteal block, and we also applied a radial gutter cast to his right upper extremity for his index metacarpal fracture.  There were no complications and he tolerated the procedure well.  3 views of the right ankle taken at the completion of the case demonstrate anatomic alignment with internal fixation and restoration of the articular surface.

## 2017-04-18 NOTE — Anesthesia Preprocedure Evaluation (Addendum)

## 2017-04-21 ENCOUNTER — Encounter (HOSPITAL_COMMUNITY): Payer: Self-pay | Admitting: Orthopedic Surgery

## 2017-06-25 ENCOUNTER — Encounter: Payer: Self-pay | Admitting: Family Medicine

## 2017-06-25 ENCOUNTER — Ambulatory Visit: Payer: 59 | Admitting: Family Medicine

## 2017-06-25 VITALS — BP 136/72 | HR 96 | Temp 97.8°F | Wt 173.0 lb

## 2017-06-25 DIAGNOSIS — Z8659 Personal history of other mental and behavioral disorders: Secondary | ICD-10-CM | POA: Diagnosis not present

## 2017-06-25 DIAGNOSIS — R030 Elevated blood-pressure reading, without diagnosis of hypertension: Secondary | ICD-10-CM | POA: Diagnosis not present

## 2017-06-25 DIAGNOSIS — Z0000 Encounter for general adult medical examination without abnormal findings: Secondary | ICD-10-CM
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
## 2017-06-25 NOTE — Patient Instructions (Signed)
Preventive Care 18-39 Years, Male Preventive care refers to lifestyle choices and visits with your health care provider that can promote health and wellness. What does preventive care include?  A yearly physical exam. This is also called an annual well check.  Dental exams once or twice a year.  Routine eye exams. Ask your health care provider how often you should have your eyes checked.  Personal lifestyle choices, including: ? Daily care of your teeth and gums. ? Regular physical activity. ? Eating a healthy diet. ? Avoiding tobacco and drug use. ? Limiting alcohol use. ? Practicing safe sex. What happens during an annual well check? The services and screenings done by your health care provider during your annual well check will depend on your age, overall health, lifestyle risk factors, and family history of disease. Counseling Your health care provider may ask you questions about your:  Alcohol use.  Tobacco use.  Drug use.  Emotional well-being.  Home and relationship well-being.  Sexual activity.  Eating habits.  Work and work Statistician.  Screening You may have the following tests or measurements:  Height, weight, and BMI.  Blood pressure.  Lipid and cholesterol levels. These may be checked every 5 years starting at age 9.  Diabetes screening. This is done by checking your blood sugar (glucose) after you have not eaten for a while (fasting).  Skin check.  Hepatitis C blood test.  Hepatitis B blood test.  Sexually transmitted disease (STD) testing.  Discuss your test results, treatment options, and if necessary, the need for more tests with your health care provider. Vaccines Your health care provider may recommend certain vaccines, such as:  Influenza vaccine. This is recommended every year.  Tetanus, diphtheria, and acellular pertussis (Tdap, Td) vaccine. You may need a Td booster every 10 years.  Varicella vaccine. You may need this if you  have not been vaccinated.  HPV vaccine. If you are 89 or younger, you may need three doses over 6 months.  Measles, mumps, and rubella (MMR) vaccine. You may need at least one dose of MMR.You may also need a second dose.  Pneumococcal 13-valent conjugate (PCV13) vaccine. You may need this if you have certain conditions and have not been vaccinated.  Pneumococcal polysaccharide (PPSV23) vaccine. You may need one or two doses if you smoke cigarettes or if you have certain conditions.  Meningococcal vaccine. One dose is recommended if you are age 32-21 years and a first-year college student living in a residence hall, or if you have one of several medical conditions. You may also need additional booster doses.  Hepatitis A vaccine. You may need this if you have certain conditions or if you travel or work in places where you may be exposed to hepatitis A.  Hepatitis B vaccine. You may need this if you have certain conditions or if you travel or work in places where you may be exposed to hepatitis B.  Haemophilus influenzae type b (Hib) vaccine. You may need this if you have certain risk factors.  Talk to your health care provider about which screenings and vaccines you need and how often you need them. This information is not intended to replace advice given to you by your health care provider. Make sure you discuss any questions you have with your health care provider. Document Released: 05/07/2001 Document Revised: 11/29/2015 Document Reviewed: 01/10/2015 Elsevier Interactive Patient Education  2018 [employer].  Managing Your Hypertension Hypertension is commonly called high blood pressure. This is when the  force of your blood pressing against the walls of your arteries is too strong. Arteries are blood vessels that carry blood from your heart throughout your body. Hypertension forces the heart to work harder to pump blood, and may cause the arteries to become narrow or stiff. Having  untreated or uncontrolled hypertension can cause heart attack, stroke, kidney disease, and other problems. What are blood pressure readings? A blood pressure reading consists of a higher number over a lower number. Ideally, your blood pressure should be below 120/80. The first ("top") number is called the systolic pressure. It is a measure of the pressure in your arteries as your heart beats. The second ("bottom") number is called the diastolic pressure. It is a measure of the pressure in your arteries as the heart relaxes. What does my blood pressure reading mean? Blood pressure is classified into four stages. Based on your blood pressure reading, your health care provider may use the following stages to determine what type of treatment you need, if any. Systolic pressure and diastolic pressure are measured in a unit called mm Hg. Normal  Systolic pressure: below 601.  Diastolic pressure: below 80. Elevated  Systolic pressure: 093-235.  Diastolic pressure: below 80. Hypertension stage 1  Systolic pressure: 573-220.  Diastolic pressure: 25-42. Hypertension stage 2  Systolic pressure: 706 or above.  Diastolic pressure: 90 or above. What health risks are associated with hypertension? Managing your hypertension is an important responsibility. Uncontrolled hypertension can lead to:  A heart attack.  A stroke.  A weakened blood vessel (aneurysm).  Heart failure.  Kidney damage.  Eye damage.  Metabolic syndrome.  Memory and concentration problems.  What changes can I make to manage my hypertension? Hypertension can be managed by making lifestyle changes and possibly by taking medicines. Your health care provider will help you make a plan to bring your blood pressure within a normal range. Eating and drinking  Eat a diet that is high in fiber and potassium, and low in salt (sodium), added sugar, and fat. An example eating plan is called the DASH (Dietary Approaches to Stop  Hypertension) diet. To eat this way: ? Eat plenty of fresh fruits and vegetables. Try to fill half of your plate at each meal with fruits and vegetables. ? Eat whole grains, such as whole wheat pasta, brown rice, or whole grain bread. Fill about one quarter of your plate with whole grains. ? Eat low-fat diary products. ? Avoid fatty cuts of meat, processed or cured meats, and poultry with skin. Fill about one quarter of your plate with lean proteins such as fish, chicken without skin, beans, eggs, and tofu. ? Avoid premade and processed foods. These tend to be higher in sodium, added sugar, and fat.  Reduce your daily sodium intake. Most people with hypertension should eat less than 1,500 mg of sodium a day.  Limit alcohol intake to no more than 1 drink a day for nonpregnant women and 2 drinks a day for men. One drink equals 12 oz of beer, 5 oz of wine, or 1 oz of hard liquor. Lifestyle  Work with your health care provider to maintain a healthy body weight, or to lose weight. Ask what an ideal weight is for you.  Get at least 30 minutes of exercise that causes your heart to beat faster (aerobic exercise) most days of the week. Activities may include walking, swimming, or biking.  Include exercise to strengthen your muscles (resistance exercise), such as weight lifting, as part of  your weekly exercise routine. Try to do these types of exercises for 30 minutes at least 3 days a week.  Do not use any products that contain nicotine or tobacco, such as cigarettes and e-cigarettes. If you need help quitting, ask your health care provider.  Control any long-term (chronic) conditions you have, such as high cholesterol or diabetes. Monitoring  Monitor your blood pressure at home as told by your health care provider. Your personal target blood pressure may vary depending on your medical conditions, your age, and other factors.  Have your blood pressure checked regularly, as often as told by your  health care provider. Working with your health care provider  Review all the medicines you take with your health care provider because there may be side effects or interactions.  Talk with your health care provider about your diet, exercise habits, and other lifestyle factors that may be contributing to hypertension.  Visit your health care provider regularly. Your health care provider can help you create and adjust your plan for managing hypertension. Will I need medicine to control my blood pressure? Your health care provider may prescribe medicine if lifestyle changes are not enough to get your blood pressure under control, and if:  Your systolic blood pressure is 130 or higher.  Your diastolic blood pressure is 80 or higher.  Take medicines only as told by your health care provider. Follow the directions carefully. Blood pressure medicines must be taken as prescribed. The medicine does not work as well when you skip doses. Skipping doses also puts you at risk for problems. Contact a health care provider if:  You think you are having a reaction to medicines you have taken.  You have repeated (recurrent) headaches.  You feel dizzy.  You have swelling in your ankles.  You have trouble with your vision. Get help right away if:  You develop a severe headache or confusion.  You have unusual weakness or numbness, or you feel faint.  You have severe pain in your chest or abdomen.  You vomit repeatedly.  You have trouble breathing. Summary  Hypertension is when the force of blood pumping through your arteries is too strong. If this condition is not controlled, it may put you at risk for serious complications.  Your personal target blood pressure may vary depending on your medical conditions, your age, and other factors. For most people, a normal blood pressure is less than 120/80.  Hypertension is managed by lifestyle changes, medicines, or both. Lifestyle changes include  weight loss, eating a healthy, low-sodium diet, exercising more, and limiting alcohol. This information is not intended to replace advice given to you by your health care provider. Make sure you discuss any questions you have with your health care provider. Document Released: 12/04/2011 Document Revised: 02/07/2016 Document Reviewed: 02/07/2016 Elsevier Interactive Patient Education  [employer].

## 2017-06-30 ENCOUNTER — Encounter: Payer: 59 | Admitting: Family Medicine

## 2017-06-30 DIAGNOSIS — Z0289 Encounter for other administrative examinations: Secondary | ICD-10-CM

## 2017-07-01 ENCOUNTER — Ambulatory Visit (INDEPENDENT_AMBULATORY_CARE_PROVIDER_SITE_OTHER): Payer: 59 | Admitting: Family Medicine

## 2017-07-01 ENCOUNTER — Telehealth: Payer: Self-pay | Admitting: Family Medicine

## 2017-07-01 ENCOUNTER — Ambulatory Visit: Payer: 59

## 2017-07-01 DIAGNOSIS — R030 Elevated blood-pressure reading, without diagnosis of hypertension: Secondary | ICD-10-CM | POA: Diagnosis not present

## 2017-07-01 NOTE — Telephone Encounter (Signed)
Please advise 

## 2017-07-01 NOTE — Telephone Encounter (Signed)
Pt was here in office today for B/P check, did nurse visit. Patient wanted to know if he needed any labs done and when to come back for follow up on B/P?

## 2017-07-01 NOTE — Progress Notes (Signed)
Patient here for BP check per Dr. Salomon FickBanks. BP today was 120/80, advised pt to continue to monitor and will call him with further advice, provider is not in office at this time, will be here later on today. He is not currently on any BP medication, has home monitor and will use.

## 2017-07-01 NOTE — Telephone Encounter (Signed)
Pt can return in 2 wks for nurse's visit for bp.  He should bring his bp log with him.  No labs needed at this time.

## 2017-07-03 NOTE — Telephone Encounter (Signed)
Left a detailed vocie message stating, a nurse visit for bp check and pleaes bring BP log.

## 2018-01-01 ENCOUNTER — Telehealth: Payer: Self-pay | Admitting: Family Medicine

## 2018-01-01 NOTE — Telephone Encounter (Signed)
Copied from CRM [id number]. Topic: Appointment Scheduling - Transfer of Care >> Jan 01, 2018  4:23 PM Arlyss Gandy, NT wrote: Pt is requesting to transfer FROM: Dr. Salomon Fick Pt is requesting to transfer TO: Dr. Doreene Burke Reason for requested transfer: Was not happy with Dr. Desmond Dike CRM to patient's current PCP (transferring FROM).

## 2018-01-01 NOTE — Telephone Encounter (Signed)
Please advise 

## 2018-01-05 NOTE — Telephone Encounter (Signed)
Ok with me 
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
## 2019-10-25 ENCOUNTER — Ambulatory Visit (INDEPENDENT_AMBULATORY_CARE_PROVIDER_SITE_OTHER): Payer: BC Managed Care – PPO | Admitting: Otolaryngology

## 2022-10-22 DIAGNOSIS — H16223 Keratoconjunctivitis sicca, not specified as Sjogren's, bilateral: Secondary | ICD-10-CM | POA: Diagnosis not present

## 2022-10-22 DIAGNOSIS — H16102 Unspecified superficial keratitis, left eye: Secondary | ICD-10-CM | POA: Diagnosis not present

## 2022-11-07 DIAGNOSIS — H01001 Unspecified blepharitis right upper eyelid: Secondary | ICD-10-CM | POA: Diagnosis not present

## 2022-11-07 DIAGNOSIS — H01002 Unspecified blepharitis right lower eyelid: Secondary | ICD-10-CM | POA: Diagnosis not present

## 2022-11-07 DIAGNOSIS — H04123 Dry eye syndrome of bilateral lacrimal glands: Secondary | ICD-10-CM | POA: Diagnosis not present

## 2022-11-07 DIAGNOSIS — H01004 Unspecified blepharitis left upper eyelid: Secondary | ICD-10-CM | POA: Diagnosis not present
